# Patient Record
Sex: Female | Born: 1960 | Race: White | Hispanic: No | Marital: Married | Smoking: Never smoker
Health system: Southern US, Community
[De-identification: ages and names within clinical notes are randomized; demographics above are authoritative.]

## PROBLEM LIST (undated history)

## (undated) DIAGNOSIS — M069 Rheumatoid arthritis, unspecified: Secondary | ICD-10-CM

## (undated) DIAGNOSIS — D464 Refractory anemia, unspecified: Secondary | ICD-10-CM

## (undated) HISTORY — DX: Refractory anemia, unspecified: D46.4

## (undated) HISTORY — DX: Rheumatoid arthritis, unspecified: M06.9

---

## 1993-04-04 HISTORY — PX: BREAST LUMPECTOMY: SHX2

## 1997-04-04 HISTORY — PX: JOINT REPLACEMENT: SHX530

## 2001-01-30 ENCOUNTER — Other Ambulatory Visit: Admission: RE | Admit: 2001-01-30 | Discharge: 2001-01-30 | Payer: Self-pay | Admitting: Family Medicine

## 2001-04-27 ENCOUNTER — Encounter: Payer: Self-pay | Admitting: Orthopedic Surgery

## 2001-04-28 ENCOUNTER — Inpatient Hospital Stay (HOSPITAL_COMMUNITY): Admission: RE | Admit: 2001-04-28 | Discharge: 2001-04-29 | Payer: Self-pay | Admitting: Orthopedic Surgery

## 2001-08-10 ENCOUNTER — Encounter: Admission: RE | Admit: 2001-08-10 | Discharge: 2001-08-10 | Payer: Self-pay | Admitting: Hematology and Oncology

## 2001-08-10 ENCOUNTER — Encounter: Payer: Self-pay | Admitting: Hematology and Oncology

## 2002-08-22 ENCOUNTER — Encounter: Admission: RE | Admit: 2002-08-22 | Discharge: 2002-08-22 | Payer: Self-pay | Admitting: Family Medicine

## 2002-08-22 ENCOUNTER — Encounter: Payer: Self-pay | Admitting: Family Medicine

## 2003-10-29 ENCOUNTER — Encounter: Admission: RE | Admit: 2003-10-29 | Discharge: 2003-10-29 | Payer: Self-pay | Admitting: Oncology

## 2004-11-16 ENCOUNTER — Encounter: Admission: RE | Admit: 2004-11-16 | Discharge: 2004-11-16 | Payer: Self-pay | Admitting: Oncology

## 2005-09-30 ENCOUNTER — Other Ambulatory Visit: Admission: RE | Admit: 2005-09-30 | Discharge: 2005-09-30 | Payer: Self-pay | Admitting: Obstetrics and Gynecology

## 2005-11-17 ENCOUNTER — Encounter: Admission: RE | Admit: 2005-11-17 | Discharge: 2005-11-17 | Payer: Self-pay | Admitting: Oncology

## 2005-11-22 ENCOUNTER — Encounter (INDEPENDENT_AMBULATORY_CARE_PROVIDER_SITE_OTHER): Payer: Self-pay | Admitting: Specialist

## 2005-11-22 ENCOUNTER — Ambulatory Visit (HOSPITAL_BASED_OUTPATIENT_CLINIC_OR_DEPARTMENT_OTHER): Admission: RE | Admit: 2005-11-22 | Discharge: 2005-11-22 | Payer: Self-pay | Admitting: Orthopedic Surgery

## 2006-06-29 ENCOUNTER — Encounter (INDEPENDENT_AMBULATORY_CARE_PROVIDER_SITE_OTHER): Payer: Self-pay | Admitting: Specialist

## 2006-06-29 ENCOUNTER — Ambulatory Visit (HOSPITAL_COMMUNITY): Admission: RE | Admit: 2006-06-29 | Discharge: 2006-06-29 | Payer: Self-pay | Admitting: Obstetrics and Gynecology

## 2007-10-16 ENCOUNTER — Encounter: Admission: RE | Admit: 2007-10-16 | Discharge: 2007-10-16 | Payer: Self-pay | Admitting: Oncology

## 2008-06-07 ENCOUNTER — Emergency Department (HOSPITAL_COMMUNITY): Admission: EM | Admit: 2008-06-07 | Discharge: 2008-06-07 | Payer: Self-pay | Admitting: Emergency Medicine

## 2008-06-13 ENCOUNTER — Ambulatory Visit (HOSPITAL_BASED_OUTPATIENT_CLINIC_OR_DEPARTMENT_OTHER): Admission: RE | Admit: 2008-06-13 | Discharge: 2008-06-13 | Payer: Self-pay | Admitting: Orthopedic Surgery

## 2008-10-29 ENCOUNTER — Encounter: Admission: RE | Admit: 2008-10-29 | Discharge: 2008-10-29 | Payer: Self-pay | Admitting: Internal Medicine

## 2009-10-30 ENCOUNTER — Encounter: Admission: RE | Admit: 2009-10-30 | Discharge: 2009-10-30 | Payer: Self-pay | Admitting: Internal Medicine

## 2010-07-15 LAB — POCT HEMOGLOBIN-HEMACUE: Hemoglobin: 12.2 g/dL (ref 12.0–15.0)

## 2010-08-05 ENCOUNTER — Other Ambulatory Visit: Payer: Self-pay | Admitting: Otolaryngology

## 2010-08-05 DIAGNOSIS — R220 Localized swelling, mass and lump, head: Secondary | ICD-10-CM

## 2010-08-12 ENCOUNTER — Ambulatory Visit
Admission: RE | Admit: 2010-08-12 | Discharge: 2010-08-12 | Disposition: A | Discharge status: Home / Self Care | Payer: Self-pay | Source: Ambulatory Visit | Attending: Otolaryngology | Admitting: Otolaryngology

## 2010-08-12 DIAGNOSIS — R220 Localized swelling, mass and lump, head: Secondary | ICD-10-CM

## 2010-08-12 MED ORDER — IOHEXOL 300 MG/ML  SOLN
75.0000 mL | Freq: Once | INTRAMUSCULAR | Status: AC | PRN
  Administered 2010-08-12: 75 mL via INTRAVENOUS

## 2010-08-17 NOTE — Op Note (Signed)
NAMEAI, SONNENFELD          ACCOUNT NO.:  [id number]   MEDICAL RECORD NO.:  [id number]          PATIENT TYPE:  AMB   LOCATION:  DSC                          FACILITY:  [facility]   PHYSICIAN:  Katy Fitch. Sypher, M.D. DATE OF BIRTH:  31-Dec-1960   DATE OF PROCEDURE:  06/13/2008  DATE OF DISCHARGE:                               OPERATIVE REPORT   PREOPERATIVE DIAGNOSES:  Impacted, comminuted, displaced articular  fracture of left small finger proximal phalangeal head at proximal  interphalangeal joint with background rheumatoid arthritis and severe  osteopenia.   POSTOPERATIVE DIAGNOSES:  Impacted, comminuted, displaced articular  fracture of left small finger proximal phalangeal head at proximal  interphalangeal joint with background rheumatoid arthritis and severe  osteopenia.   OPERATION:  Closed reduction with best effort reconstruction of proximal  interphalangeal joint followed by 0.028 Kirschner wire fixation x3 with  accomplishment of a 95% reduction with correction of angulation and  depression of fracture fragments.   OPERATING SURGEON:  Katy Fitch. Sypher, MD   ASSISTANT:  Marveen Reeks Dasnoit, PA-C   ANESTHESIA:  General by LMA.   SUPERVISING ANESTHESIOLOGIST:  Zenon Mayo, MD   INDICATIONS:  Lockie Bothun is a 50 year old woman with  longstanding history of rheumatoid arthritis.  She has had multiple  reconstructive procedures performed in [place], [place].  She is  followed by Dr. Syliva Overman, rheumatologist, in [place].   On June 07, 2008, she had an injury during which she twisted her left  small finger at the PIP joint.  She was seen for evaluation and  management, had x-rays which revealed a comminuted displaced  interarticular fracture of the proximal phalanx with marked displacement  of the ulnar condyle and a central depressed fragment and a butterfly  fragment on the ulnar aspect of the proximal phalangeal head.   We were had a  lengthy informed consent with her at our office on June 10, 2007.   At that time, we advised her that open reduction and internal fixation  with these condylar fracture fragments and her background rheumatoid  arthritis would likely lead to marked stiffness and could lead to  avascular necrosis of the bone, indeed the nature of this fracture could  already have prompted the development of avascular necrosis of portions  of the joint surface.   We advised closed reduction, maintaining all of the blood supply to the  fragments followed by Kirschner wire fixation.  The goal of this  intervention is to either create a stable and acceptable articular  surface or realigning of the fracture fragments to allow a secondary  arthroplasty at a later date.   At some point in the future, Ms. Wolfrey is going to require implant  arthroplasty of her left index metacarpophalangeal joint due to  subluxation and bone-on-bone arthropathy.   Preoperatively, questions were invited and answered in detail.  After  informed consent, she is brought to the operating room at this time.   PROCEDURE:  Kathrene Alu was brought to the operating room and  placed in the supine position upon the operating table.   Following the induction of  general anesthesia by LMA technique, the left  arm was prepped with Betadine soap solution and sterilely draped.  Upon  exsanguination of the left arm with Esmarch bandage, arterial tourniquet  was inflated to 220 mmHg.  With the aid of C-arm fluoroscope with the  fluoroscope in a magnification mode of 200%, the fracture fragments were  manually manipulated followed by use of a fenestrated clamp.  We were  able to level the articular surface and correct the ulnar deviation  through the fracture site.   A small butterfly fragment was still slightly displaced.  On the lateral  images, we were able to reconstruct the overall roundness of the  condyles.   After 95%  reduction was achieved, 2 transverse pins were placed,  leveling the joint surface followed by an oblique pin maintaining a  neutral position with respect to radial ulnar deviation.   An acceptable reduction in my judgment was achieved.   Our goal will be to leave the internal fixation pins in place for 3  weeks.  Thereafter, we will begin immediate range of motion exercises.   Ms. Debes understands that we still might need to consider  arthroplasty should avascular necrosis ensue or marked stiffness ensue.   There were no apparent complications.   While asleep, she was placed in a distal forearm based ulnar-sided  splint maintaining the ring and small fingers in the safe position.  There were no apparent complications.      Katy Fitch Sypher, M.D.  Electronically Signed     RVS/MEDQ  D:  06/13/2008  T:  06/13/2008  Job:  [id number]   cc:   Lemmie Evens, M.D.

## 2010-08-20 NOTE — Op Note (Signed)
NAMEDALYLAH, Shannon Valdez NO.:  [id number]   MEDICAL RECORD NO.:  [id number]          PATIENT TYPE:  AMB   LOCATION:  DSC                          FACILITY:  [facility]   PHYSICIAN:  Leonides Grills, M.D.     DATE OF BIRTH:  1960-09-23   DATE OF PROCEDURE:  11/22/2005  DATE OF DISCHARGE:                                 OPERATIVE REPORT   PREOPERATIVE DIAGNOSES:  1. Left 2nd through 4th toes valgus deformity.  2. Left 2nd and 3rd toes extension proximal interphalangeal joint      deformity.  3. 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   POSTOPERATIVE DIAGNOSES:  1. Left 2nd through 4th toes valgus deformity.  2. Left 2nd and 3rd toes extension proximal interphalangeal joint      deformity.  3. 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   OPERATION PERFORMED:  1. Left 2nd through 4th toes metatarsophalangeal joint lateral capsular      release with medial capsular plication.  2. Second through 4th toes K-wire fixation.  3. Left 2nd and 3rd toes proximal interphalangeal joint flexion osteotomy.  4. Left 2nd and 3rd toes rheumatoid nodule excisions.  5. Stress x-rays of foot.   ANESTHESIA:  General.   SURGEON:  Leonides Grills, M.D.   ASSISTANT:  None.   COMPLICATIONS:  None.   TOURNIQUET TIME:  One hour and 50 minutes.   COMPLICATIONS:  None.   DISPOSITION:  Stable to the PR.   INDICATIONS FOR PROCEDURE:  This is a 50 year old female who has had  longstanding rheumatoid arthritis.  She has had previous forefoot  reconstruction and has developed a valgus deformity to her MTP joints and  extension deformity to her 2nd and 3rd toe PIP joints.  She has also  developed 2nd and 3rd toe synthetic nodules.  She was consented for the  above procedure.  All risks which include infection, nerve and vessel  injury, recurrence of the deformity, worsening of the deformity,  neurovascular injury that may require amputation secondary to ischemia,  recurrence of rheumatoid nodules were all explained.  Questions were  encouraged and answered.   DESCRIPTION OF PROCEDURE:  The patient was brought to the operating room and  placed in the supine position.  Initially after adequate general  endotracheal anesthesia was administered as well as Ancef 1 gm IV piggyback,  the left lower extremity was prepped and draped in a sterile manner over  proximally placed thigh tourniquet.  Limb was gravity exsanguinated and  tourniquet was elevated to 290 mmHg.  A longitudinal incision on the dorsal  aspect of the left 2nd toe was then made.  Dissection was carried down  through scar tissue carefully.  Hemostasis was obtained.  This was very  difficult.  We chose a plane lateral to the extensor mechanism.  We then  identified the MTP joint and performed a lateral capsular release.  We then  dissected to the medial side of the joint and incised the capsule, dissected  the more proximal end for later plication.  We then dissected carefully  around the PIP joint.  This was  fused in an extended position.  An osteotomy  was then made using a bone cutter and this was carefully dissected out.  Extensor mechanism was preserved.  Once a flexion osteotomy was made with  the bone cutter, we then placed a K-wire antegrade through the middle and  distal phalanx, reduced the PIP joint osteotomy site and then fired this  retrograde into the proximal phalanx.  We then reduced the MTP joint and  then fired the K-wire proximally into the metatarsal with the toe held in  reduced position.  We then plicated the medial capsule of the 2nd MTP joint  using 3-0 PDS suture.  We then dissected out through separate incision the  rheumatoid nodule plantarly.  Once this was dissected out, this was sent to  pathology.  The area was copiously irrigated with normal saline.  We then  made a longitudinal incision over the 3rd toe dorsally.  Dissection was  carried down through skin and  scar tissue.  Hemostasis was obtained.  The  same interval was taken lateral to the extensor tendons carefully to the  lateral capsule.  The same exact procedure for the 2nd toe was done for the  3rd toe including excision of the rheumatoid nodule which was now plantar  lateral aspect of the 2nd toe.  Dissection around the nodule was carefully  performed to not injure the neurovascular structures.  Again, this was fixed  with a K-wire the same way that the 2nd toe was done.  We then made a  longitudinal incision over the dorsal aspect of the 4th toe.  Dissection was  carried down through scar as well.  Again, this was laterally based next to  the extensor tendon.  The lateral capsule was released as well as the medial  capsule was prepared for plication.  We then placed a K-wire this time  retrograde through the tip of the toe under C-arm guidance.  We placed this  in the proper position as well.   Once the K-wire was placed under C-arm guidance, we then repaired the medial  capsule plicating it with 3-0 PDS suture.  Tourniquet was deflated and  hemostasis was obtained.  All toes pinked up nicely and bleeding at the tip  of the K-wire.  Skin relieving incisions were made on either side of the K-  wire with an 11 blade scalpel.  K-wires were bent and cut and capped.  There  was no pulsatile bleeding.  All wounds were copiously irrigated with normal  saline.  Skin was closed with 4-0 nylon suture.  Overall wounds sterile  dressing was applied.  A hard-sole shoe was applied.   Also, prior to wound closure, stress x-rays were obtained in AP and lateral  planes and showed that the fixation was in the proper position.  Toes were  well aligned and there was no gross motion.   The patient was stable to the PR.      Leonides Grills, M.D.  Electronically Signed     PB/MEDQ  D:  11/22/2005  T:  11/22/2005  Job:  [id number]

## 2010-08-20 NOTE — Op Note (Signed)
[facility]  Patient:    Shannon Valdez, Shannon Valdez Visit Number: [id number] MRN: [id number]          Service Type: MED Location: [id number] Attending Physician:  Sherri Rad Dictated by:   Sherri Rad, M.D. Proc. Date: 04/27/01 Admit Date:  04/27/2001 Discharge Date: 04/29/2001                             Operative Report  PREOPERATIVE DIAGNOSES: 1. Left hallux valgus. 2. Left bunion. 3. Second through fifth hammer toes. 4. Second metatarsalgia.  POSTOPERATIVE DIAGNOSES: 1. Left hallux valgus. 2. Left bunion. 3. Second through fifth hammer toes. 4. Second metatarsalgia.  PROCEDURE: 1. Left chevron bunionectomy. 2. Left fifth metatarsal varus correctional osteotomy. 3. Second metatarsal shortening osteotomy. 4. Second through fifth toes metatarsophalangeal dorsal capsulotomy with    collateral release. 5. Second through fifth toes proximal phalanx head resections. 6. Left second through fifth toes flexor digitorum longus to proximal phalanx    transfer. 7. Left second through fifth toes extensor digitorum brevis to extensor    digitorum longus transfers.  ANESTHESIA:  General endotracheal tube.  SURGEON:  Sherri Rad, M.D.  ASSISTANT:  Druscilla Brownie. Shela Nevin, P.A.  ESTIMATED BLOOD LOSS:  Minimal.  TOURNIQUET TIME:  Two hours.  COMPLICATIONS:  None.  DISPOSITION:  Stable to PAR.  INDICATION:  This is a 50 year old female who underwent a right forefoot reconstructive procedure in September 2001 and was staged to have this procedure done approximately a year to a year and a half later.  She had very good result on the right side and has had pain that has been interfering with her life to the point where she cannot do what she wants to do in regard to pain on the forefoot aspect and deformities involving her lesser toes as well as her great toe.  She was consented for the above procedure.  All risks, which include  infection, neurovascular injury, recurrence of deformity, persistence of pain, worsening of pain, nonunion, malunion, hardware irritation, hardware failure, possible future surgery, were all explained and questions were answered.  She also has a history of rheumatoid arthritis for 20 years, which has been under control.  DESCRIPTION OF PROCEDURE:  Patient brought to the operating room and placed in supine position.  After adequate general endotracheal tube anesthesia was administered as well as Ancef 1 g IV piggyback, the left lower extremity was then prepped and draped in a sterile manner over a proximally-placed thigh tourniquet.  We started the procedure with a longitudinal incision over the medial aspect of the left great toe metatarsophalangeal joint.  Dissection was carried down to capsule.  The capsule was then ellipsed.  The joint was then opened.  A bunionectomy was done with the sagittal saw.  We then made a chevron osteotomy with a long dorsal limb approximately 60 degrees.  Once the osteotomy was done, the metatarsal head was translated laterally.  Once this was done, this was held reduced with a two-point reduction clamp.  Two 1.5 mm glide screws were then placed to hole the reduction.  The remaining bone medially was then resected with an oscillating saw.  Once this was done, the wound was copiously irrigated with normal saline.  The toe was then ranged, and it had excellent tracking and motion.  We then performed a capsule repair by pulling the capsule medially to relocate the sesamoid.  This was  done after the capsule laterally was released within the joint just around the dorsal aspect of the MTP joint laterally.  This was repaired with 2-0 Vicryl suture. This had the toe in excellent correction.  We then made a longitudinal incision over the fifth metatarsal dorsolaterally.  Dissection was carried down to bone using a scissors, protecting the neurovascular structures.   Once the soft tissues were elevated both dorsally and plantarly around the fifth metatarsal, the oscillating saw was used to create an oblique osteotomy.  Once this was done, a glide hole was then placed in the most distal aspect of the osteotome and used as a hinge.  We then hinged the proximal aspect out to correct the deformity and once this was done, this was clamped with a two-point reduction clamp and the final glide screw was then placed distally. The screws used were 1.5 mm fully-threaded cortical screws.  This maintained the reduction adequately as well.  We then trimmed off the excess bone with the oscillating saw.  We then copiously irrigated the wound with normal saline and the skin was closed with 4-0 nylon _____ two-step stitch, as was the great toe.  Then we made a longitudinal incision over the second toe and extended it dorsally for a separate exposure to the second metatarsal.  We then approached the second metatarsal, retracting the tendons laterally.  We then went and elevated the soft tissues both medially and laterally.  We applied a four-hole quarter-tubular plate and drilled the most distal screw hole with a 2 mm drill hole, measured the screw, and then removed the plate. We then made an approximately 3-4 mm resection of the cortical bone with an oscillating saw and then reapproximated the ends and applied the plate.  A 2.7 mm fully-threaded cortical screw was then placed.  We then reduced the osteotomy and applied the remaining screw.  This reduced the osteotomy very well.  One portion of the osteotomy was anatomic.  The other portion was slightly open approximately a millimeter.  We then through the distal aspect of the second toe wound then performed the hammer toe correction.  We released the EDB distal laterally and the EDL proximal medially.  The tendons were then moved out of place.  The MTP joint was then encountered and a dorsal capsulotomy with collateral  release was done with a 15 blade scalpel.  We then entered the PIP joint.  The proximal phalanx head was resected with a rongeur.  Through the plantar plate of the PIP joint, a longitudinal incision was made, the FDL tendon was identified and tenotomized as distal as possible.  As 2.7 mm drill hole was then placed in the base of the proximal phalanx.  The FDL tendon was then transferred through this hole dorsally.  A 4/5 double-trocar K-wire was then placed through the middle and distal phalanx through the PIP joint out the tip of the toe.  Then with the toe adequately reduced and the foot at neutral dorsiflexion and tension on the FDL tendon, the K-wire was then fired across the MTP joint with the toe held anatomically. Once this was done, the FDL was then transferred to the EDB and the EDB was then transferred to the EDL dorsally.  The K-wire was bent 90 degrees.  Caps were applied.  Skin-relieving incisions were made around the medial and lateral aspects of the K-wire, respectively.  This procedure was done for the third, fourth, and fifth toes, respectively, identically as was the second. Final  x-rays in the AP and lateral intraoperative planes were then done and showed the hardware in adequate position, no evidence of malreduction or poor position.  We backed out the second K-wire slightly at that time.  The tourniquet was released at the end of the procedure.  All toes pinked up nicely with palpable pulse and bleeding out the tip of the toe.  Wounds were then copiously irrigated with normal saline.  Skin was closed with 4-0 nylon _____ two-step stitch.  Sterile dressing was applied.  A Jones dressing was applied.  Patient stable to PAR. Dictated by:   Sherri Rad, M.D. Attending Physician:  Sherri Rad DD:  04/27/01 TD:  04/29/01 Job: [id number]

## 2010-08-20 NOTE — Op Note (Signed)
NAMEJEANENNE, Shannon Valdez NO.:  [id number]   MEDICAL RECORD NO.:  [id number]          PATIENT TYPE:  AMB   LOCATION:  SDC                           FACILITY:  [facility]   PHYSICIAN:  Janine Limbo, M.D.DATE OF BIRTH:  Apr 10, 1960   DATE OF PROCEDURE:  06/29/2006  DATE OF DISCHARGE:                               OPERATIVE REPORT   PREOPERATIVE DIAGNOSIS:  1. Menorrhagia  2. Endometrial polyps.  3. Anemia (hemoglobin 11.5).   POSTOPERATIVE DIAGNOSIS:  1. Menorrhagia  2. Endometrial polyps.  3. Anemia (hemoglobin 11.5).   PROCEDURE:  1. Hysteroscopy.  2. Resection of endometrial polyps.  3. Dilatation and curettage.  4. Endometrial ablation.   SURGEON:  Janine Limbo, M.D.   FIRST ASSISTANT:  None.   ANESTHETIC:  General.   DISPOSITION:  Ms. Bedgood is a 50 year old female, para 1-0-0-1 who  presents with the above-mentioned diagnosis.  She understands the  indications for her surgical procedure; and she accepts the risks of,  but not limited to, anesthetic complications, bleeding, infections, and  possible damage to surrounding organs.   FINDINGS:  The uterus was upper limits normal size.  No adnexal masses  were appreciated.  Upon hysteroscopy the patient was noted to have two  endometrial polyps present on the posterior surface of the uterus.  No  other endometrial polyp endometrial pathology was noted.   DESCRIPTION OF PROCEDURE:  The patient was taken to the operating room  where a general anesthetic was given.  The patient's abdomen, perineum,  and vagina were prepped with multiple layers of Betadine.  The bladder  was drained of urine.  Examination under anesthesia was performed after  the patient was sterilely draped.  A paracervical block was placed using  10 mL of 1/2% Marcaine with epinephrine.  An additional 10 mL of 1/2%  Marcaine with epinephrine were injected directly into the cervix.   An endocervical curettage was obtained.  The  uterus sounded to 8 cm.  The endocervix sounded to 4 cm.  The cervix was gently dilated.  The  diagnostic hysteroscope was inserted into the endometrial cavity; and  the cavity was carefully inspected.  Pictures were taken.  The polyps  were noted as mentioned above.  Lactated Ringer's was used for the  distension medium.  At this point we substituted sorbitol for the  lactated Ringer's; and the cavity was carefully irrigated.  The  diagnostic hysteroscope was removed; and the cervix was then dilated  slightly more.  The operative hysteroscope was inserted.  Using the  single loop, the polyps were resected from the posterior portion of the  endometrium.  Care was taken not to damage the myometrium.   Once the polyps were removed.  We then irrigated the sorbitol from the  endometrial cavity using lactated Ringer's.  A gentle uterine curettage  was then performed until the cavity was felt to be clean.  The NovaSure  instrument was then placed inside the uterus.  The cavity width was  noted to be 4 cm.  The cavity length was noted to be 4 cm.  The power  was set at  88.  A test procedure was performed; and the cavity was noted  to be intact.  We then ablated the endometrial cavity using the NovaSure  instrument for 1 minute 41 seconds.  The patient tolerated her procedure  well.  The NovaSure instrument was removed; and the hysteroscope was  again inserted.  The cavity was noted to be totally ablated.  Pictures  were taken prior to the ablation; and after the ablation.  All  instruments were then removed.  Hemostasis was noted to be adequate.  Sponge, needle, and instrument counts were correct on two occasions.  The estimated blood loss for the procedure was 5 mL.  The estimated  fluid deficit for the procedure was 145 mL.   The patient tolerated her procedure well.  She was awakened from her  anesthetic without difficulty; and then taken to the recovery room in  stable condition.  The  endocervical curettage, endometrial polyps, and  the endometrial curettings were all sent to pathology for official  evaluation.   FOLLOWUP INSTRUCTIONS:  The patient will take ibuprofen 600 mg q.6 h. as  needed for mild-to-moderate pain.  She was given a prescription for  Vicodin and she can take 1 or 2 tablets q.4 h. as needed for severe  pain.  She will return to see Dr. Stefano Gaul in 2-3 weeks for follow-up  examination.  She was given a copy of the postoperative instruction  sheet as prepared by the [facility] for patients who  have undergone a dilatation and curettage.  The patient will continue  contraception.  She will refrain from intercourse until all bleeding and  discharge have resolved.      Janine Limbo, M.D.  Electronically Signed     AVS/MEDQ  D:  06/29/2006  T:  06/29/2006  Job:  [id number]

## 2010-08-20 NOTE — H&P (Signed)
NAMEHONOR, FRISON NO.:  [id number]   MEDICAL RECORD NO.:  [id number]          PATIENT TYPE:  AMB   LOCATION:  SDC                           FACILITY:  [facility]   PHYSICIAN:  Janine Limbo, M.D.DATE OF BIRTH:  [birth year]   DATE OF ADMISSION:  DATE OF DISCHARGE:                              HISTORY & PHYSICAL   HISTORY OF PRESENT ILLNESS:  Ms. Garin is a 50 year old female, para  1-0-1, who presents for hysteroscopy with dilatation and curettage, and  endometrial ablation.  She will also have resection of an endometrial  polyp.  The patient has been followed at the [facility]  and Gynecology division of [facility].  The patient  complains of menorrhagia.  An endometrial biopsy was performed that  showed benign elements.  A hydro-sonogram was performed that showed a  uterus that measured 9.8 centimeters x 7.4 centimeters.  The uterus  appeared normal. Two polyps were noted in the endometrium.  The  patient's most recent Pap smear was within normal limits.   PAST MEDICAL HISTORY:  The patient has a history of rheumatoid  arthritis.  She also has a past history of migraine headaches.  She has  had multiple orthopedic surgeries including a right joint replacement in  her hand, carpal tunnel surgery, surgery on both wrists, and surgery on  both feet and ankles.   CURRENT MEDICATIONS:  Include:  Remicade infusions every 6 weeks.   DRUG ALLERGIES:  TETRACYCLINE, SULFA MEDICATIONS, AND ALOE.   OBSTETRIC HISTORY:  The patient has had one term vaginal delivery.   SOCIAL HISTORY:  The patient denies cigarette use and recreational drug  use, other than social alcohol.   REVIEW OF SYSTEMS:  Please see history of present illness.   FAMILY HISTORY:  The patient's mother has a history of a deep venous  thrombosis, as well as cancer.  The patient's father has diabetes.  The  patient's father has heart disease.   PHYSICAL  EXAMINATION:  VITAL SIGNS:  Weight is 206 pounds, height is 5  feet 8 inches.  HEENT:  Within normal limits.  CHEST:  Clear.  HEART:  Regular rate and rhythm.  BREASTS:  Without masses.  ABDOMEN:  Nontender.  EXTREMITIES:  Show changes from her rheumatoid arthritis.  NEUROLOGIC:  Limited because of rheumatoid arthritis.  PELVIC:  External genitalia is normal.  The vagina is normal, except for  relaxation.  Cervix is nontender, and no lesions are appreciated.  The  uterus is upper limits normal size.  Adnexa:  No masses are appreciated.  Rectovaginal exam confirms.   ASSESSMENT:  1. Menometrorrhagia.  2. Endometrial polyps.  3. Rheumatoid arthritis.   PLAN:  The patient will undergo hysteroscopy with resection of polyps.  She will also have a dilatation and curettage.  She will also have  NovaSure ablation of the endometrium.  The patient understands the  indications for her surgical procedure, and she accepts the risk of, but  not limited to, and aesthetic complications, bleeding, infections, and  possible damage to the surrounding organs.      Janine Limbo, M.D.  Electronically Signed     AVS/MEDQ  D:  06/28/2006  T:  06/28/2006  Job:  [id number]

## 2010-10-07 ENCOUNTER — Other Ambulatory Visit: Payer: Self-pay | Admitting: Internal Medicine

## 2010-10-07 DIAGNOSIS — Z1231 Encounter for screening mammogram for malignant neoplasm of breast: Secondary | ICD-10-CM

## 2010-11-02 ENCOUNTER — Ambulatory Visit: Payer: Self-pay

## 2010-11-02 ENCOUNTER — Ambulatory Visit
Admission: RE | Admit: 2010-11-02 | Discharge: 2010-11-02 | Disposition: A | Discharge status: Home / Self Care | Payer: BC Managed Care – PPO | Source: Ambulatory Visit | Attending: Internal Medicine | Admitting: Internal Medicine

## 2010-11-02 DIAGNOSIS — Z1231 Encounter for screening mammogram for malignant neoplasm of breast: Secondary | ICD-10-CM

## 2012-09-24 ENCOUNTER — Other Ambulatory Visit: Payer: Self-pay

## 2012-09-24 DIAGNOSIS — Z1231 Encounter for screening mammogram for malignant neoplasm of breast: Secondary | ICD-10-CM

## 2012-09-28 ENCOUNTER — Ambulatory Visit: Payer: BC Managed Care – PPO

## 2012-10-02 ENCOUNTER — Ambulatory Visit
Admission: RE | Admit: 2012-10-02 | Discharge: 2012-10-02 | Disposition: A | Discharge status: Home / Self Care | Payer: BC Managed Care – PPO | Source: Ambulatory Visit

## 2012-10-02 DIAGNOSIS — Z1231 Encounter for screening mammogram for malignant neoplasm of breast: Secondary | ICD-10-CM

## 2012-10-10 ENCOUNTER — Other Ambulatory Visit: Payer: Self-pay | Admitting: Internal Medicine

## 2012-10-10 DIAGNOSIS — R109 Unspecified abdominal pain: Secondary | ICD-10-CM

## 2012-10-12 ENCOUNTER — Ambulatory Visit
Admission: RE | Admit: 2012-10-12 | Discharge: 2012-10-12 | Disposition: A | Discharge status: Home / Self Care | Payer: BC Managed Care – PPO | Source: Ambulatory Visit | Attending: Internal Medicine | Admitting: Internal Medicine

## 2012-10-12 DIAGNOSIS — R109 Unspecified abdominal pain: Secondary | ICD-10-CM

## 2014-03-25 ENCOUNTER — Other Ambulatory Visit: Payer: Self-pay

## 2014-03-25 DIAGNOSIS — Z1231 Encounter for screening mammogram for malignant neoplasm of breast: Secondary | ICD-10-CM

## 2014-03-31 ENCOUNTER — Ambulatory Visit
Admission: RE | Admit: 2014-03-31 | Discharge: 2014-03-31 | Disposition: A | Discharge status: Home / Self Care | Payer: BC Managed Care – PPO | Source: Ambulatory Visit

## 2014-03-31 DIAGNOSIS — Z1231 Encounter for screening mammogram for malignant neoplasm of breast: Secondary | ICD-10-CM

## 2014-04-07 ENCOUNTER — Ambulatory Visit: Payer: BC Managed Care – PPO

## 2015-03-26 ENCOUNTER — Other Ambulatory Visit: Payer: Self-pay

## 2015-03-26 DIAGNOSIS — Z1231 Encounter for screening mammogram for malignant neoplasm of breast: Secondary | ICD-10-CM

## 2015-04-24 ENCOUNTER — Ambulatory Visit
Admission: RE | Admit: 2015-04-24 | Discharge: 2015-04-24 | Disposition: A | Discharge status: Home / Self Care | Payer: BLUE CROSS/BLUE SHIELD | Source: Ambulatory Visit

## 2015-04-24 DIAGNOSIS — Z1231 Encounter for screening mammogram for malignant neoplasm of breast: Secondary | ICD-10-CM

## 2019-02-14 ENCOUNTER — Other Ambulatory Visit: Payer: Self-pay | Admitting: Obstetrics and Gynecology

## 2019-02-14 DIAGNOSIS — Z1231 Encounter for screening mammogram for malignant neoplasm of breast: Secondary | ICD-10-CM

## 2019-03-19 ENCOUNTER — Telehealth: Payer: Self-pay | Admitting: *Deleted

## 2019-03-19 NOTE — Telephone Encounter (Signed)
A message was left, re: her new patient appointment. 

## 2019-03-27 ENCOUNTER — Telehealth: Payer: Self-pay | Admitting: *Deleted

## 2019-03-27 NOTE — Telephone Encounter (Signed)
We have left several messages, for her new patient appointment with no response.

## 2019-04-09 ENCOUNTER — Other Ambulatory Visit: Payer: Self-pay

## 2019-04-09 ENCOUNTER — Ambulatory Visit
Admission: RE | Admit: 2019-04-09 | Discharge: 2019-04-09 | Disposition: A | Discharge status: Home / Self Care | Payer: BC Managed Care – PPO | Source: Ambulatory Visit | Attending: Obstetrics and Gynecology | Admitting: Obstetrics and Gynecology

## 2019-04-09 DIAGNOSIS — Z1231 Encounter for screening mammogram for malignant neoplasm of breast: Secondary | ICD-10-CM

## 2019-04-15 DIAGNOSIS — R002 Palpitations: Secondary | ICD-10-CM | POA: Insufficient documentation

## 2019-04-15 DIAGNOSIS — Z7189 Other specified counseling: Secondary | ICD-10-CM | POA: Insufficient documentation

## 2019-04-15 DIAGNOSIS — R072 Precordial pain: Secondary | ICD-10-CM | POA: Insufficient documentation

## 2019-04-15 NOTE — Progress Notes (Signed)
Cardiology Office Note   Date:  04/16/2019   ID:  Shannon Valdez, DOB 05-Mar-1961, MRN [id number]  PCP:  Everett Graff, MD  Cardiologist:   No primary care provider on file.   Chief Complaint  Patient presents with  . Palpitations      History of Present Illness: Shannon Valdez is a 59 y.o. female who is referred by Everett Graff, MDfor evaluation of and palpitations.  The patient had a history of PVCs she said about 10 years ago.  She describes a Lexicographer.  There was no therapy necessary.  She presents now for recurrent symptoms.  She gets palpitations 2-3 times per week.  She feels a skip and then maybe several rapid beats in a row.  She said the entire episode lasts for about 10 seconds.  She notices it mostly at night.  It probably is worse when she is fatigued.  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  She cannot bring this on with activities.  She denies any chest pressure, neck or arm discomfort.  She is not had any frank syncope.  She does drink caffeine but tries to do decaffeinated beverages.  He has no weight gain or edema.  She has no new shortness of breath, PND or orthopnea.  She did have blood work not long ago to include normal thyroid.  Electrolytes were okay.   Past Medical History:  Diagnosis Date  . RA (refractory anemia) (HCC)   . Rheumatoid arthritis [facility])     Past Surgical History:  Procedure Laterality Date  . BREAST LUMPECTOMY  1995  . JOINT REPLACEMENT  1999     Current Outpatient Medications  Medication Sig Dispense Refill  . Cetirizine HCl (ZYRTEC ALLERGY PO) Take by mouth.    . inFLIXimab (REMICADE IV) Remicade     No current facility-administered medications for this visit.    Allergies:   Patient has no allergy information on record.    Social History:  The patient  reports that she has never smoked. She has never used smokeless tobacco.   Family History:  The  patient's family history includes Cancer in her mother; Diabetes in her father; Heart attack in her maternal grandfather; Hypertension in her sister; Pulmonary embolism in her maternal grandmother.    ROS:  Please see the history of present illness.   Otherwise, review of systems are positive for none.   All other systems are reviewed and negative.    PHYSICAL EXAM: VS:  BP 118/78   Pulse (!) 51   Temp 98.1 F (36.7 C)   Ht 5\' 8"  (1.727 m)   Wt 224 lb 12.8 oz (102 kg)   SpO2 99%   BMI 34.18 kg/m  , BMI Body mass index is 34.18 kg/m. GENERAL:  Well appearing HEENT:  Pupils equal round and reactive, fundi not visualized, oral mucosa unremarkable NECK:  No jugular venous distention, waveform within normal limits, carotid upstroke brisk and symmetric, no bruits, no thyromegaly LYMPHATICS:  No cervical, inguinal adenopathy LUNGS:  Clear to auscultation bilaterally BACK:  No CVA tenderness CHEST:  Unremarkable HEART:  PMI not displaced or sustained,S1 and S2 within normal limits, no S3, no S4, no clicks, no rubs, no murmurs ABD:  Flat, positive bowel sounds normal in frequency in pitch, no bruits, no rebound, no guarding, no midline pulsatile mass, no hepatomegaly, no splenomegaly EXT:  2 plus pulses throughout, no edema, no cyanosis  no clubbing SKIN:  No rashes no nodules NEURO:  Cranial nerves II through XII grossly intact, motor grossly intact throughout PSYCH:  Cognitively intact, oriented to person place and time    EKG:  EKG is ordered today. The ekg ordered today demonstrates sinus bradycardia, rate 51, axis within normal limits intervals within normal limits, no acute ST-T wave changes.   Recent Labs: No results found for requested labs within last 8760 hours.    Lipid Panel No results found for: CHOL, TRIG, HDL, CHOLHDL, VLDL, LDLCALC, LDLDIRECT    Wt Readings from Last 3 Encounters:  04/16/19 224 lb 12.8 oz (102 kg)      Other studies Reviewed: Additional  studies/ records that were reviewed today include: Labs, primary office records. Review of the above records demonstrates:  Please see elsewhere in the note.     ASSESSMENT AND PLAN:   PALPITATIONS:    He is probably having PVCs or PACs but I am going to have her wear a 2-week event monitor.  We talked at length about treatment if this is the case.  She would prefer not to take anything at this point but we could consider a beta-blocker in the future.  She is going to try to cut down on her caffeine.  At this point I do not think further imaging is indicated but if her symptoms get worse I will have a low threshold for echocardiography.  COVID EDUCATION: We discussed the vaccine and I asked her to discuss this with her hematologist continue he thinks there is any contraindication.  Current medicines are reviewed at length with the patient today.  The patient does not have concerns regarding medicines.  The following changes have been made:  no change  Labs/ tests ordered today include:   Orders Placed This Encounter  Procedures  . Cardiac event monitor  . EKG 12-Lead     Disposition:   FU with me as needed.      Signed, Minus Breeding, MD  04/16/2019 5:42 PM    [facility]

## 2019-04-16 ENCOUNTER — Other Ambulatory Visit: Payer: Self-pay

## 2019-04-16 ENCOUNTER — Telehealth: Payer: Self-pay | Admitting: Radiology

## 2019-04-16 ENCOUNTER — Encounter: Payer: Self-pay | Admitting: Cardiology

## 2019-04-16 ENCOUNTER — Ambulatory Visit (INDEPENDENT_AMBULATORY_CARE_PROVIDER_SITE_OTHER): Payer: BC Managed Care – PPO | Admitting: Cardiology

## 2019-04-16 VITALS — BP 118/78 | HR 51 | Temp 98.1°F | Ht 68.0 in | Wt 224.8 lb

## 2019-04-16 DIAGNOSIS — Z7189 Other specified counseling: Secondary | ICD-10-CM | POA: Diagnosis not present

## 2019-04-16 DIAGNOSIS — R002 Palpitations: Secondary | ICD-10-CM | POA: Diagnosis not present

## 2019-04-16 DIAGNOSIS — R072 Precordial pain: Secondary | ICD-10-CM

## 2019-04-16 NOTE — Patient Instructions (Signed)
Medication Instructions:  No changes *If you need a refill on your cardiac medications before your next appointment, please call your pharmacy*  Lab Work: None  Testing/Procedures: Your physician has recommended that you wear an event monitor. Event monitors are medical devices that record the heart's electrical activity. Doctors most often [place] these monitors to diagnose arrhythmias. Arrhythmias are problems with the speed or rhythm of the heartbeat. The monitor is a small, portable device. You can wear one while you do your normal daily activities. This is usually used to diagnose what is causing palpitations/syncope (passing out).   Follow-Up: At [facility], you and your health needs are our priority.  As part of our continuing mission to provide you with exceptional heart care, we have created designated Provider Care Teams.  These Care Teams include your primary Cardiologist (physician) and Advanced Practice Providers (APPs -  Physician Assistants and Nurse Practitioners) who all work together to provide you with the care you need, when you need it.  Other Instructions Follow up as needed    [employer] Cardiac Event Monitor Instructions Your physician has requested you wear your cardiac event monitor for 14 days. [employer] may call or text to confirm a shipping address. The monitor will be sent to a land address via UPS. [employer] will not ship a monitor to a PO BOX. It typically takes 3-5 days to receive your monitor after it has been enrolled. [employer] will assist with USPS tracking if your package is delayed. The telephone number for [employer] is [phone]. Once you have received your monitor, please review the enclosed instructions. Instruction tutorials can also be viewed under help and settings on the enclosed cell phone. Your monitor has already been registered assigning a specific monitor serial # to you.  Applying the monitor Remove cell phone from case and  turn it on. The cell phone works as Dealer and needs to be within [employer] of you at all times. The cell phone will need to be charged on a daily basis. We recommend you plug the cell phone into the enclosed charger at your bedside table every night.  Monitor batteries: You will receive two monitor batteries labelled #1 and #2. These are your recorders. Plug battery #2 onto the second connection on the enclosed charger. Keep one battery on the charger at all times. This will keep the monitor battery deactivated. It will also keep it fully charged for when you need to switch your monitor batteries. A small light will be blinking on the battery emblem when it is charging. The light on the battery emblem will remain on when the battery is fully charged.  Open package of a Monitor strip. Insert battery #1 into black hood on strip and gently squeeze monitor battery onto connection as indicated in instruction booklet. Set aside while preparing skin.  Choose location for your strip, vertical or horizontal, as indicated in the instruction booklet. Shave to remove all hair from location. There cannot be any lotions, oils, powders, or colognes on skin where monitor is to be applied. Wipe skin clean with enclosed Saline wipe. Dry skin completely.  Peel paper labeled #1 off the back of the Monitor strip exposing the adhesive. Place the monitor on the chest in the vertical or horizontal position shown in the instruction booklet. One arrow on the monitor strip must be pointing upward. Carefully remove paper labeled #2, attaching remainder of strip to your skin. Try not to create any folds or wrinkles in the strip as  you apply it.  Firmly press and release the circle in the center of the monitor battery. You will hear a small beep. This is turning the monitor battery on. The heart emblem on the monitor battery will light up every 5 seconds if the monitor battery in turned on and connected to the  patient securely. Do not push and hold the circle down as this turns the monitor battery off. The cell phone will locate the monitor battery. A screen will appear on the cell phone checking the connection of your monitor strip. This may read poor connection initially but change to good connection within the next minute. Once your monitor accepts the connection you will hear a series of 3 beeps followed by a climbing crescendo of beeps. A screen will appear on the cell phone showing the two monitor strip placement options. Touch the picture that demonstrates where you applied the monitor strip.  Your monitor strip and battery are waterproof. You are able to shower, bathe, or swim with the monitor on. They just ask you do not submerge deeper than 3 feet underwater. We recommend removing the monitor if you are swimming in a lake, river, or ocean.  Your monitor battery will need to be switched to a fully charged monitor battery approximately once a week. The cell phone will alert you of an action which needs to be made.  On the cell phone, tap for details to reveal connection status, monitor battery status, and cell phone battery status. The green dots indicates your monitor is in good status. A red dot indicates there is something that needs your attention.  To record a symptom, click the circle on the monitor battery. In 30-60 seconds a list of symptoms will appear on the cell phone. Select your symptom and tap save. Your monitor will record a sustained or significant arrhythmia regardless of you clicking the button. Some patients do not feel the heart rhythm irregularities. [employer] will notify us of any serious or critical events.  Refer to instruction booklet for instructions on switching batteries, changing strips, the Do not disturb or Pause features, or any additional questions.  Call [employer] at [phone], to confirm your monitor is transmitting and record your baseline. They  will answer any questions you may have regarding the monitor instructions at that time.  Returning the monitor to [place] all equipment back into blue box. Peel off strip of paper to expose adhesive and close box securely. There is a prepaid UPS shipping label on this box. Drop in a UPS drop box, or at a UPS facility like [employer]. You may also contact [employer] to arrange UPS to pick up monitor package at your home.

## 2019-04-16 NOTE — Telephone Encounter (Signed)
Enrolled patient for a 14 day [employer] Event monitor to be mailed to patients home.  

## 2019-04-30 ENCOUNTER — Encounter (INDEPENDENT_AMBULATORY_CARE_PROVIDER_SITE_OTHER): Payer: BC Managed Care – PPO

## 2019-04-30 DIAGNOSIS — R002 Palpitations: Secondary | ICD-10-CM

## 2019-06-03 ENCOUNTER — Telehealth: Payer: Self-pay | Admitting: Cardiology

## 2019-06-03 NOTE — Telephone Encounter (Signed)
See result note.  

## 2019-06-03 NOTE — Telephone Encounter (Signed)
Patient states she is calling requesting results from 14 day [employer] Event Monitor. Please call.

## 2020-07-05 IMAGING — MG DIGITAL SCREENING BILAT W/ TOMO W/ CAD
8 series · 8 of 24 positions shown · non-contrast
Comparison: Previous exam(s).

CLINICAL DATA: Screening.

EXAM:
DIGITAL SCREENING BILATERAL MAMMOGRAM WITH TOMO AND CAD

[R MLO synth-2D]
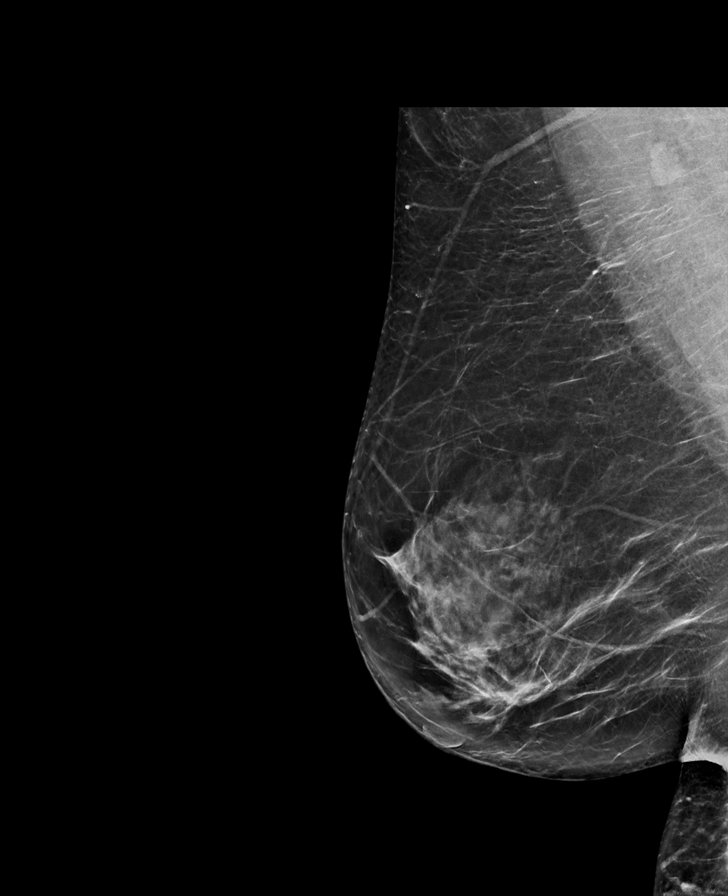

[R CC synth-2D]
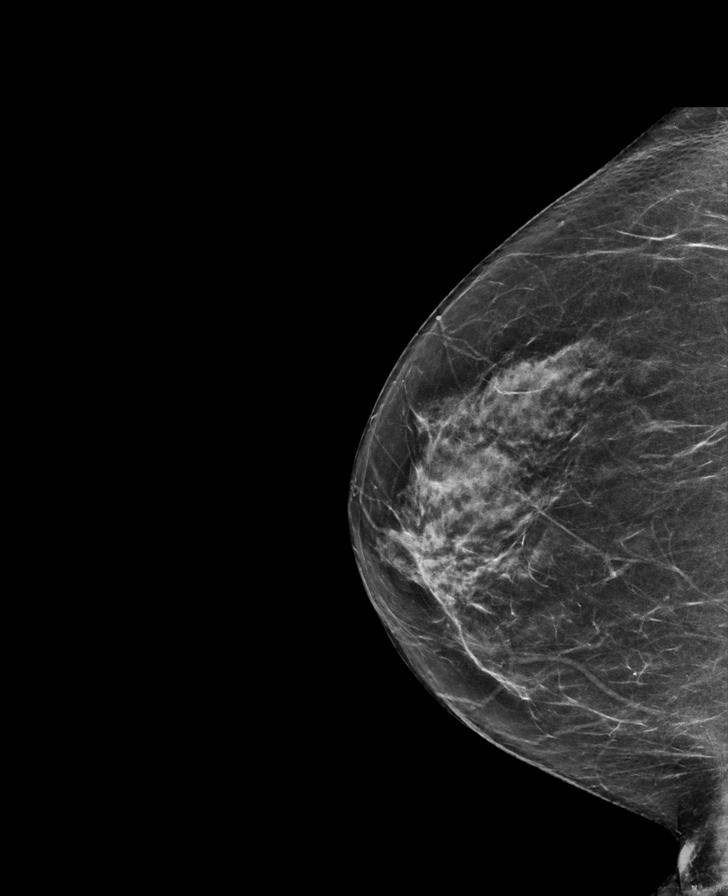

[L MLO synth-2D]
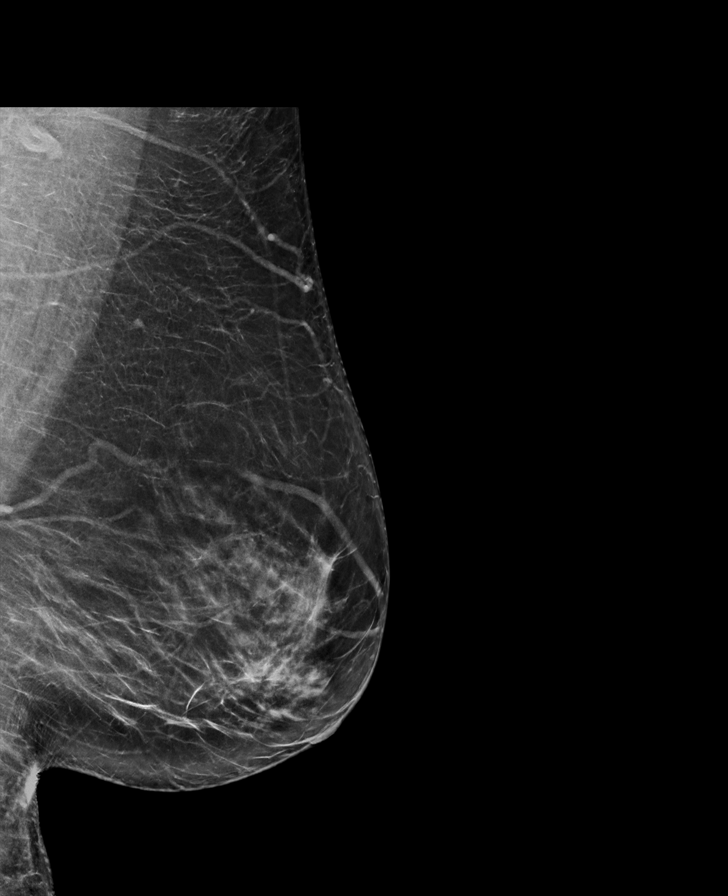

[L CC synth-2D]
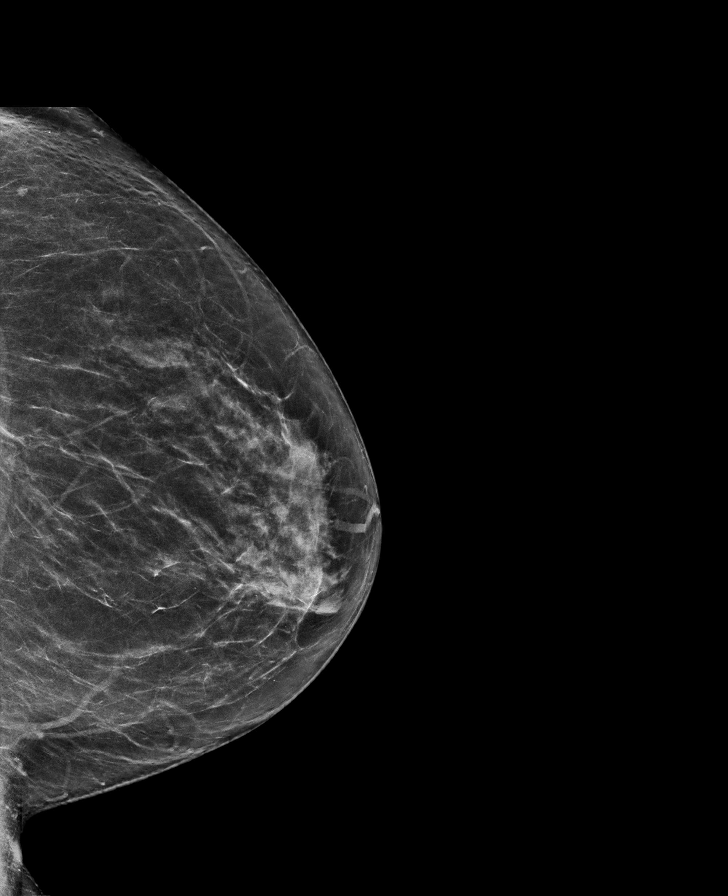

[L CC tomo · tomo slice 36/71.0]
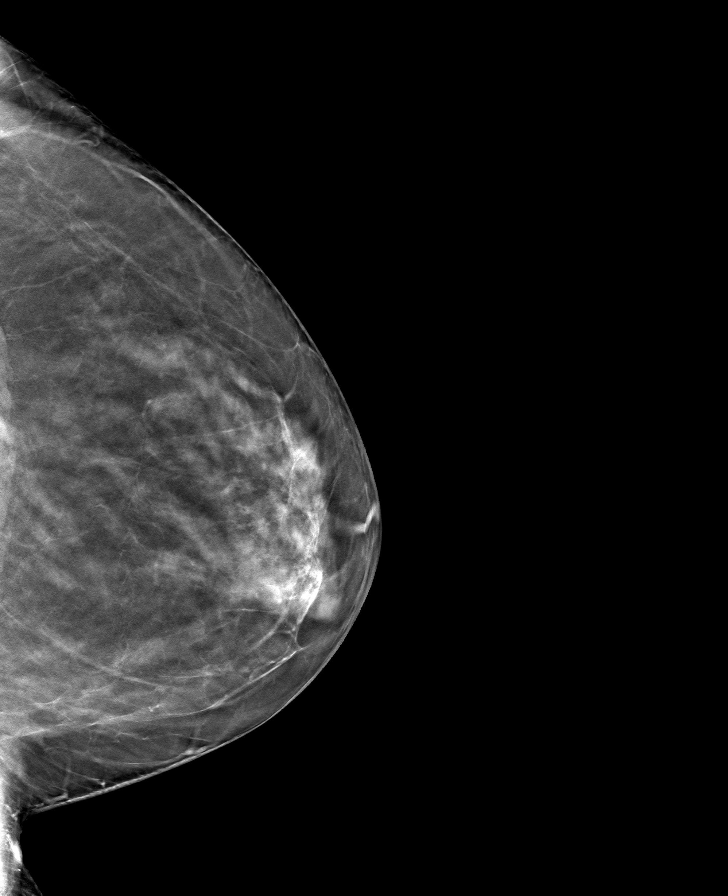

[R CC tomo · tomo slice 35/69.0]
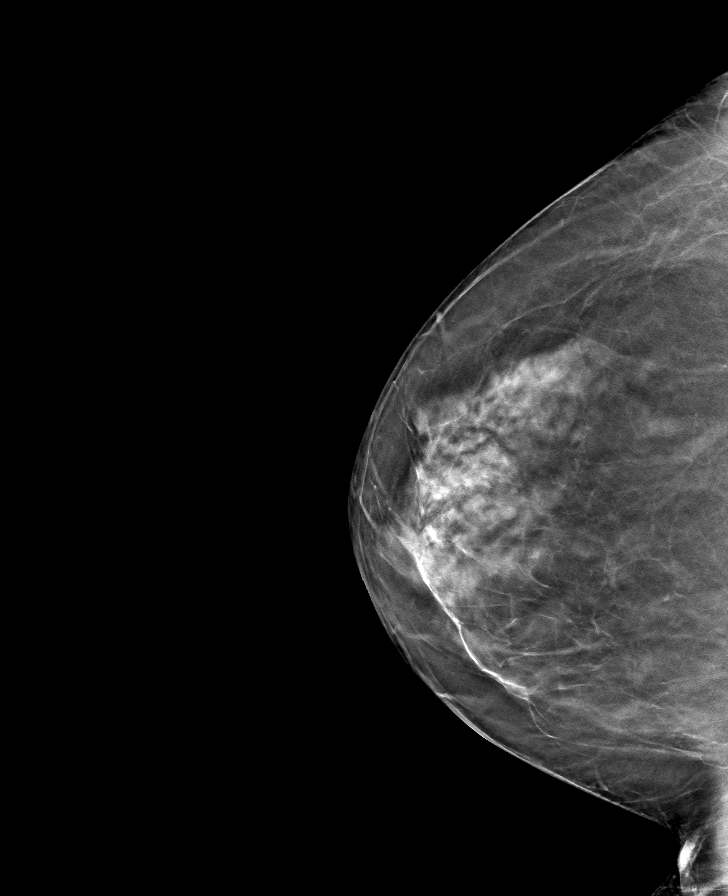

[R MLO tomo · tomo slice 40/79.0]
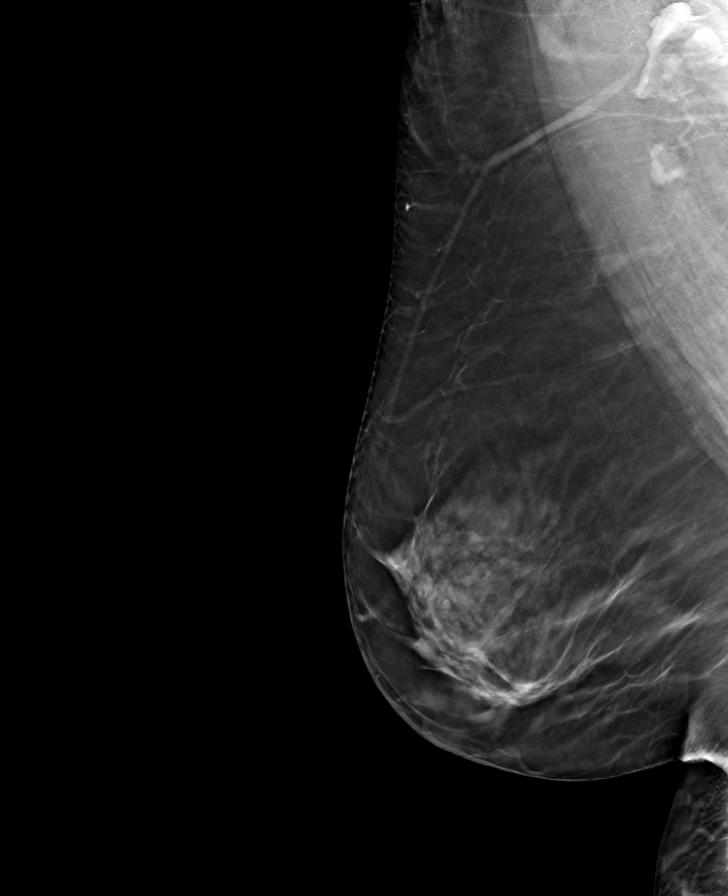

[L MLO tomo · tomo slice 41/81.0]
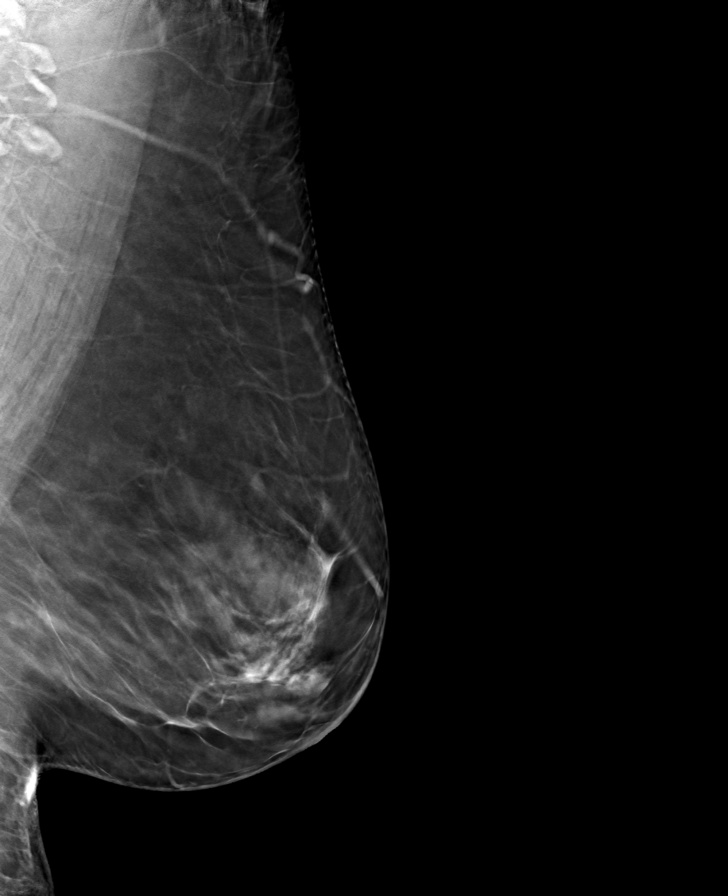

[8 of 24 positions shown; findings below may reference images not displayed]

ACR Breast Density Category c: The breast tissue is heterogeneously
dense, which may obscure small masses.
FINDINGS: There are no findings suspicious for malignancy. Images were
processed with CAD.
IMPRESSION: No mammographic evidence of malignancy. A result letter of this
screening mammogram will be mailed directly to the patient.

RECOMMENDATION:
Screening mammogram in one year. (Code:FT-U-LHB)

BI-RADS CATEGORY  1: Negative.

## 2020-11-06 ENCOUNTER — Ambulatory Visit
Admission: RE | Admit: 2020-11-06 | Discharge: 2020-11-06 | Disposition: A | Discharge status: Home / Self Care | Payer: BC Managed Care – PPO | Source: Ambulatory Visit | Attending: Obstetrics and Gynecology | Admitting: Obstetrics and Gynecology

## 2020-11-06 ENCOUNTER — Other Ambulatory Visit: Payer: Self-pay

## 2020-11-06 ENCOUNTER — Other Ambulatory Visit: Payer: Self-pay | Admitting: Obstetrics and Gynecology

## 2020-11-06 DIAGNOSIS — Z1231 Encounter for screening mammogram for malignant neoplasm of breast: Secondary | ICD-10-CM

## 2021-06-18 ENCOUNTER — Other Ambulatory Visit: Payer: Self-pay | Admitting: Internal Medicine

## 2021-06-18 DIAGNOSIS — M8580 Other specified disorders of bone density and structure, unspecified site: Secondary | ICD-10-CM

## 2021-06-22 ENCOUNTER — Other Ambulatory Visit: Payer: Self-pay | Admitting: Internal Medicine

## 2021-06-22 ENCOUNTER — Other Ambulatory Visit: Payer: Self-pay | Admitting: Obstetrics and Gynecology

## 2021-06-22 DIAGNOSIS — Z1231 Encounter for screening mammogram for malignant neoplasm of breast: Secondary | ICD-10-CM

## 2021-12-03 ENCOUNTER — Ambulatory Visit
Admission: RE | Admit: 2021-12-03 | Discharge: 2021-12-03 | Disposition: A | Discharge status: Home / Self Care | Payer: BC Managed Care – PPO | Source: Ambulatory Visit | Attending: Obstetrics and Gynecology | Admitting: Obstetrics and Gynecology

## 2021-12-03 ENCOUNTER — Ambulatory Visit
Admission: RE | Admit: 2021-12-03 | Discharge: 2021-12-03 | Disposition: A | Discharge status: Home / Self Care | Payer: BC Managed Care – PPO | Source: Ambulatory Visit | Attending: Internal Medicine | Admitting: Internal Medicine

## 2021-12-03 DIAGNOSIS — M8580 Other specified disorders of bone density and structure, unspecified site: Secondary | ICD-10-CM

## 2021-12-03 DIAGNOSIS — Z1231 Encounter for screening mammogram for malignant neoplasm of breast: Secondary | ICD-10-CM

## 2023-08-11 ENCOUNTER — Other Ambulatory Visit: Payer: Self-pay | Admitting: Internal Medicine

## 2023-08-11 ENCOUNTER — Other Ambulatory Visit: Payer: Self-pay | Admitting: Obstetrics and Gynecology

## 2023-08-11 DIAGNOSIS — Z1231 Encounter for screening mammogram for malignant neoplasm of breast: Secondary | ICD-10-CM

## 2023-08-11 DIAGNOSIS — M810 Age-related osteoporosis without current pathological fracture: Secondary | ICD-10-CM

## 2023-08-17 ENCOUNTER — Ambulatory Visit
Admission: RE | Admit: 2023-08-17 | Discharge: 2023-08-17 | Disposition: A | Discharge status: Home / Self Care | Source: Ambulatory Visit | Attending: Internal Medicine | Admitting: Internal Medicine

## 2023-08-17 DIAGNOSIS — Z1231 Encounter for screening mammogram for malignant neoplasm of breast: Secondary | ICD-10-CM

## 2023-12-11 ENCOUNTER — Other Ambulatory Visit

## 2024-01-25 ENCOUNTER — Other Ambulatory Visit (HOSPITAL_BASED_OUTPATIENT_CLINIC_OR_DEPARTMENT_OTHER)

## 2024-05-03 ENCOUNTER — Other Ambulatory Visit
